# Patient Record
Sex: Female | Born: 1985 | Race: White | Hispanic: No | Marital: Married | Smoking: Never smoker
Health system: Southern US, Community
[De-identification: ages and names within clinical notes are randomized; demographics above are authoritative.]

## PROBLEM LIST (undated history)

## (undated) DIAGNOSIS — L309 Dermatitis, unspecified: Secondary | ICD-10-CM

## (undated) DIAGNOSIS — F909 Attention-deficit hyperactivity disorder, unspecified type: Secondary | ICD-10-CM

## (undated) DIAGNOSIS — S2239XA Fracture of one rib, unspecified side, initial encounter for closed fracture: Secondary | ICD-10-CM

## (undated) DIAGNOSIS — F849 Pervasive developmental disorder, unspecified: Secondary | ICD-10-CM

## (undated) DIAGNOSIS — S36116A Major laceration of liver, initial encounter: Secondary | ICD-10-CM

## (undated) DIAGNOSIS — S3210XA Unspecified fracture of sacrum, initial encounter for closed fracture: Secondary | ICD-10-CM

## (undated) DIAGNOSIS — S2249XA Multiple fractures of ribs, unspecified side, initial encounter for closed fracture: Secondary | ICD-10-CM

## (undated) HISTORY — DX: Dermatitis, unspecified: L30.9

## (undated) HISTORY — DX: Pervasive developmental disorder, unspecified: F84.9

## (undated) HISTORY — DX: Multiple fractures of ribs, unspecified side, initial encounter for closed fracture: S22.49XA

## (undated) HISTORY — DX: Fracture of one rib, unspecified side, initial encounter for closed fracture: S22.39XA

## (undated) HISTORY — DX: Major laceration of liver, initial encounter: S36.116A

## (undated) HISTORY — DX: Unspecified fracture of sacrum, initial encounter for closed fracture: S32.10XA

## (undated) HISTORY — DX: Attention-deficit hyperactivity disorder, unspecified type: F90.9

---
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
## 2005-10-17 ENCOUNTER — Ambulatory Visit: Payer: Self-pay | Admitting: Internal Medicine

## 2006-10-26 DIAGNOSIS — F909 Attention-deficit hyperactivity disorder, unspecified type: Secondary | ICD-10-CM | POA: Insufficient documentation

## 2008-07-15 ENCOUNTER — Encounter: Payer: Self-pay | Admitting: Internal Medicine

## 2009-12-21 ENCOUNTER — Ambulatory Visit: Payer: Self-pay | Admitting: Internal Medicine

## 2009-12-21 DIAGNOSIS — F849 Pervasive developmental disorder, unspecified: Secondary | ICD-10-CM | POA: Insufficient documentation

## 2010-02-11 ENCOUNTER — Encounter: Payer: Self-pay | Admitting: Internal Medicine

## 2010-05-19 ENCOUNTER — Telehealth: Payer: Self-pay | Admitting: *Deleted

## 2010-07-15 ENCOUNTER — Telehealth: Payer: Self-pay | Admitting: *Deleted

## 2010-08-31 ENCOUNTER — Telehealth: Payer: Self-pay | Admitting: Internal Medicine

## 2010-09-20 ENCOUNTER — Ambulatory Visit: Payer: Self-pay | Admitting: Internal Medicine

## 2010-09-20 ENCOUNTER — Other Ambulatory Visit: Admission: RE | Admit: 2010-09-20 | Discharge: 2010-09-20 | Payer: Self-pay | Admitting: Internal Medicine

## 2010-09-20 LAB — HM PAP SMEAR

## 2010-09-21 LAB — CONVERTED CEMR LAB
AST: 20 units/L (ref 0–37)
Albumin: 4.2 g/dL (ref 3.5–5.2)
Alkaline Phosphatase: 41 units/L (ref 39–117)
BUN: 16 mg/dL (ref 6–23)
Basophils Relative: 0.6 % (ref 0.0–3.0)
Bilirubin, Direct: 0.2 mg/dL (ref 0.0–0.3)
CO2: 28 meq/L (ref 19–32)
Eosinophils Relative: 1.8 % (ref 0.0–5.0)
Free T4: 0.86 ng/dL (ref 0.60–1.60)
GFR calc non Af Amer: 87.21 mL/min (ref >60)
Glucose, Bld: 69 mg/dL — ABNORMAL LOW (ref 70–99)
HDL: 88.5 mg/dL (ref >39.00)
Hemoglobin: 14 g/dL (ref 12.0–15.0)
Lymphocytes Relative: 38.1 % (ref 12.0–46.0)
Lymphs Abs: 2 10*3/uL (ref 0.7–4.0)
MCHC: 33.7 g/dL (ref 30.0–36.0)
Monocytes Absolute: 0.5 10*3/uL (ref 0.1–1.0)
Monocytes Relative: 9.2 % (ref 3.0–12.0)
TSH: 0.99 microintl units/mL (ref 0.35–5.50)
Total Protein: 6.8 g/dL (ref 6.0–8.3)
Triglycerides: 42 mg/dL (ref 0.0–149.0)
VLDL: 8.4 mg/dL (ref 0.0–40.0)

## 2010-09-23 ENCOUNTER — Encounter: Payer: Self-pay | Admitting: *Deleted

## 2010-09-23 LAB — CONVERTED CEMR LAB: Pap Smear: NEGATIVE

## 2010-10-26 ENCOUNTER — Telehealth: Payer: Self-pay | Admitting: Internal Medicine

## 2010-11-30 ENCOUNTER — Telehealth: Payer: Self-pay | Admitting: Internal Medicine

## 2010-12-08 NOTE — Progress Notes (Signed)
Summary: concerta refill  Phone Note Call from Patient   Caller: mother,[phone] Reason for Call: Refill Medication Summary of Call: Concerta. Initial call taken by: Rudy Jew, RN,  July 15, 2010 8:43 AM  Follow-up for Phone Call        ok to refill     she has check up sced for November  Follow-up by: Madelin Headings MD,  July 15, 2010 12:18 PM  Additional Follow-up for Phone Call Additional follow up Details #1::        Left message on machine that rx is ready to pick up. Additional Follow-up by: Romualdo Bolk, CMA (AAMA),  July 15, 2010 5:09 PM    New/Updated Medications: CONCERTA 27 MG TBCR (METHYLPHENIDATE HCL) once daily Prescriptions: CONCERTA 27 MG TBCR (METHYLPHENIDATE HCL) once daily  #30 x 0   Entered by:   Romualdo Bolk, CMA (AAMA)   Authorized by:   Madelin Headings MD   Signed by:   Romualdo Bolk, CMA (AAMA) on 07/15/2010   Method used:   Print then Give to Patient   RxID:   [id number]

## 2010-12-08 NOTE — Letter (Signed)
Summary: Psychological Evaluations 1998 - 2009  Psychological Evaluations 1998 - 2009   Imported By: Maryln Gottron 01/04/2010 13:14:58  _____________________________________________________________________  External Attachment:    Type:   Image     Comment:   External Document
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
## 2010-12-08 NOTE — Medication Information (Signed)
Summary: Coverage Approval for Concerta  Coverage Approval for Concerta   Imported By: Maryln Gottron 02/15/2010 12:31:03  _____________________________________________________________________  External Attachment:    Type:   Image     Comment:   External Document

## 2010-12-08 NOTE — Letter (Signed)
Summary: Results Follow-up Letter  [place] [facility]  [address] [place], [place] [address]   Phone: [phone]  Fax: [phone]    09/23/2010  [address] [place], [place]  [address]  Dear Ms. Merida,   The following are the results of your recent test(s):  Test     Result     Pap Smear    Normal_______  Not Normal_____  If you have any questions, please give [place] a call at [phone].  Sincerely,  Tor Netters, CMA (AAMA) [place] at [facility]

## 2010-12-08 NOTE — Progress Notes (Signed)
Summary: refill   Phone Note Call from Patient Call back at Home Phone [phone]   Caller: Patient Summary of Call: refill on concerta  Initial call taken by: Romualdo Bolk, CMA Duncan Dull),  November 30, 2010 2:15 PM  Follow-up for Phone Call        Pt aware that rx will be ready in am. Follow-up by: Romualdo Bolk, CMA (AAMA),  November 30, 2010 2:18 PM    Prescriptions: CONCERTA 27 MG TBCR (METHYLPHENIDATE HCL) once daily  #30 x 0   Entered by:   Romualdo Bolk, CMA (AAMA)   Authorized by:   Madelin Headings MD   Signed by:   Romualdo Bolk, CMA (AAMA) on 11/30/2010   Method used:   Print then Give to Patient   RxID:   [id number]
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
## 2010-12-08 NOTE — Progress Notes (Signed)
Summary: refill on concerta  Phone Note Call from Patient Call back at [phone]   Caller: Patient Summary of Call: Pt needs a refill on concerta. Initial call taken by: Romualdo Bolk, CMA Duncan Dull),  August 31, 2010 8:53 AM  Follow-up for Phone Call        Pt aware that rx is ready to pick up. Follow-up by: Romualdo Bolk, CMA (AAMA),  August 31, 2010 4:02 PM    Prescriptions: CONCERTA 27 MG TBCR (METHYLPHENIDATE HCL) once daily  #30 x 0   Entered by:   Romualdo Bolk, CMA (AAMA)   Authorized by:   Madelin Headings MD   Signed by:   Romualdo Bolk, CMA (AAMA) on 08/31/2010   Method used:   Print then Give to Patient   RxID:   [id number]

## 2010-12-08 NOTE — Progress Notes (Signed)
Summary: refill  Phone Note Call from Patient Call back at Home Phone [phone]   Caller: Patient Summary of Call: refill concerta Initial call taken by: Romualdo Bolk, CMA Duncan Dull),  October 26, 2010 4:48 PM  Follow-up for Phone Call        Mom aware that rx will be ready to pick up in am. Follow-up by: Romualdo Bolk, CMA (AAMA),  October 26, 2010 4:50 PM    Prescriptions: CONCERTA 27 MG TBCR (METHYLPHENIDATE HCL) once daily  #30 x 0   Entered by:   Romualdo Bolk, CMA (AAMA)   Authorized by:   Madelin Headings MD   Signed by:   Romualdo Bolk, CMA (AAMA) on 10/26/2010   Method used:   Print then Give to Patient   RxID:   [id number]

## 2010-12-08 NOTE — Letter (Signed)
Summary: Letter from Mother  Letter from Mother   Imported By: Maryln Gottron 01/04/2010 13:12:27  _____________________________________________________________________  External Attachment:    Type:   Image     Comment:   External Document

## 2010-12-08 NOTE — Progress Notes (Signed)
Summary: new rx  Phone Note Call from Patient Call back at [phone]   Caller: Mom-rita Call For: Madelin Headings MD Summary of Call: Pt  need new rx concerta 27mg  Initial call taken by: Heron Sabins,  May 19, 2010 9:40 AM  Follow-up for Phone Call        Left message on machine about rx being ready to pick up and that she needs  a cpx before next refill. Follow-up by: Romualdo Bolk, CMA (AAMA),  May 19, 2010 11:59 AM    Prescriptions: CONCERTA 27 MG TBCR (METHYLPHENIDATE HCL) once daily  #30 x 0   Entered by:   Romualdo Bolk, CMA (AAMA)   Authorized by:   Madelin Headings MD   Signed by:   Romualdo Bolk, CMA (AAMA) on 05/19/2010   Method used:   Print then Give to Patient   RxID:   [id number]

## 2010-12-08 NOTE — Letter (Signed)
Summary: Medical History for School  Medical History for School   Imported By: Maryln Gottron 01/04/2010 13:10:26  _____________________________________________________________________  External Attachment:    Type:   Image     Comment:   External Document

## 2011-01-23 ENCOUNTER — Telehealth: Payer: Self-pay | Admitting: *Deleted

## 2011-01-23 MED ORDER — METHYLPHENIDATE HCL ER (OSM) 27 MG PO TBCR: 27.0000 mg | EXTENDED_RELEASE_TABLET | Freq: Every day | ORAL | Status: DC

## 2011-01-24 NOTE — Telephone Encounter (Signed)
Rx is ready to pick up. Left message on machine that rx is ready to pick up.

## 2011-02-21 ENCOUNTER — Telehealth: Payer: Self-pay | Admitting: *Deleted

## 2011-02-21 MED ORDER — METHYLPHENIDATE HCL ER (OSM) 27 MG PO TBCR: 27.0000 mg | EXTENDED_RELEASE_TABLET | Freq: Every day | ORAL | Status: DC

## 2011-02-21 NOTE — Telephone Encounter (Signed)
Pt needs a refill on concerta. Pt aware that rx will be ready to pick up in the am.

## 2011-03-22 ENCOUNTER — Encounter: Payer: Self-pay | Admitting: Internal Medicine

## 2011-03-22 DIAGNOSIS — S3210XA Unspecified fracture of sacrum, initial encounter for closed fracture: Secondary | ICD-10-CM | POA: Insufficient documentation

## 2011-03-22 DIAGNOSIS — F849 Pervasive developmental disorder, unspecified: Secondary | ICD-10-CM | POA: Insufficient documentation

## 2011-03-23 ENCOUNTER — Ambulatory Visit: Payer: Self-pay | Admitting: Internal Medicine
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
## 2011-04-05 ENCOUNTER — Telehealth: Payer: Self-pay | Admitting: *Deleted

## 2011-04-05 MED ORDER — METHYLPHENIDATE HCL ER (OSM) 27 MG PO TBCR: 27.0000 mg | EXTENDED_RELEASE_TABLET | Freq: Every day | ORAL | Status: DC

## 2011-04-05 NOTE — Telephone Encounter (Signed)
Pt to pick up rx tomorrow. Pt to make rov when she picks up rx.

## 2011-04-10 ENCOUNTER — Ambulatory Visit (INDEPENDENT_AMBULATORY_CARE_PROVIDER_SITE_OTHER): Payer: BC Managed Care – PPO | Admitting: Internal Medicine

## 2011-04-10 ENCOUNTER — Encounter: Payer: Self-pay | Admitting: Internal Medicine

## 2011-04-10 DIAGNOSIS — F909 Attention-deficit hyperactivity disorder, unspecified type: Secondary | ICD-10-CM

## 2011-04-10 DIAGNOSIS — F849 Pervasive developmental disorder, unspecified: Secondary | ICD-10-CM

## 2011-04-10 MED ORDER — METHYLPHENIDATE HCL ER (OSM) 27 MG PO TBCR: EXTENDED_RELEASE_TABLET | ORAL | Status: DC
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
## 2011-04-13 ENCOUNTER — Encounter: Payer: Self-pay | Admitting: Internal Medicine

## 2011-08-31 ENCOUNTER — Telehealth: Payer: Self-pay | Admitting: *Deleted

## 2011-08-31 MED ORDER — METHYLPHENIDATE HCL ER (OSM) 27 MG PO TBCR: EXTENDED_RELEASE_TABLET | ORAL | Status: DC

## 2011-08-31 NOTE — Telephone Encounter (Signed)
Refill on concerta  

## 2011-10-10 ENCOUNTER — Telehealth: Payer: Self-pay | Admitting: *Deleted

## 2011-10-10 NOTE — Telephone Encounter (Signed)
Refill on concerta. Pt got 3 rx's on 08/31/11. Pt should still have another rx. Left message to call back.

## 2011-10-20 NOTE — Telephone Encounter (Signed)
Left message to call back  

## 2011-10-24 NOTE — Telephone Encounter (Signed)
Left message to call back  

## 2011-11-03 NOTE — Telephone Encounter (Signed)
Pt never called back.

## 2012-01-03 ENCOUNTER — Telehealth: Payer: Self-pay | Admitting: *Deleted

## 2012-01-03 MED ORDER — METHYLPHENIDATE HCL ER (OSM) 27 MG PO TBCR: EXTENDED_RELEASE_TABLET | ORAL | Status: DC

## 2012-01-03 NOTE — Telephone Encounter (Signed)
Refill on concerta  Pt needs to schedule a follow up appt before next refill.

## 2012-02-06 ENCOUNTER — Encounter: Payer: Self-pay | Admitting: Internal Medicine

## 2012-02-06 ENCOUNTER — Ambulatory Visit (INDEPENDENT_AMBULATORY_CARE_PROVIDER_SITE_OTHER): Payer: BC Managed Care – PPO | Admitting: Internal Medicine

## 2012-02-06 VITALS — BP 98/64 | HR 72 | Temp 98.6°F | Ht 69.0 in | Wt 143.0 lb

## 2012-02-06 DIAGNOSIS — F849 Pervasive developmental disorder, unspecified: Secondary | ICD-10-CM

## 2012-02-06 DIAGNOSIS — F909 Attention-deficit hyperactivity disorder, unspecified type: Secondary | ICD-10-CM

## 2012-02-06 MED ORDER — METHYLPHENIDATE HCL ER (OSM) 27 MG PO TBCR: EXTENDED_RELEASE_TABLET | ORAL | Status: DC

## 2012-02-06 NOTE — Progress Notes (Signed)
  Subjective:    Patient ID: Bethany Davila, female    DOB: 03/09/1986, 26 y.o.   MRN: [id number]  HPI Patient comes in today for follow up ;   Med check . Visit alone .  Didn't get cpx as planned  ;here for med check 4 months later.  But doing very well .  Taking same med  Without sig se   And taking Concerta about 4 days per week for work and other days. Sleep and activity level is ok.  No major changes  In health  ,injury surgery or hospitalizations. Rides horses   Review of Systems No ha vision change CP SOB falling syncope . Denies sleep problem .  Periods nl cramps for  a few days  No major injury .  New rashes   Past history family history social history reviewed in the electronic medical record. Outpatient Prescriptions Prior to Visit  Medication Sig Dispense Refill  . methylphenidate (CONCERTA) 27 MG CR tablet 1 po daily. Fill on or after 10/01/11  30 tablet  0  . methylphenidate (CONCERTA) 27 MG CR tablet 1 po daily. Fill on or after 10/29/11  30 tablet  0  . methylphenidate (CONCERTA) 27 MG CR tablet 1 po daily fill  30 tablet  0       Objective:   Physical Exam  BP 98/64  Pulse 72  Temp(Src) 98.6 F (37 C) (Oral)  Ht 5\' 9"  (1.753 m)  Wt 143 lb (64.864 kg)  BMI 21.12 kg/m2  SpO2 98%  LMP 01/31/2012  Wd wn in nad.  HEENT grossly normal Neck: Supple without adenopathy or masses or bruits Chest:  Clear to A&P without wheezes rales or rhonchi CV:  S1-S2 no gallops or murmurs peripheral perfusion is normal Abdomen:  Sof,t normal bowel sounds without hepatosplenomegaly, no guarding rebound or masses no CVA tenderness NEURO no tremor abnornmal movement  Oriented x 3 . Skin: normal capillary refill ,turgor , color: No acute rashes ,petechiae or bruising      Assessment & Plan:   ADHD  developmental delay   Good effect of med no sig side effects of meds   Disc and refill for 3 months   And plan fu  CPX PV in 6 months   Total visit > 50% spent  counseling and coordinating care

## 2012-02-06 NOTE — Patient Instructions (Signed)
No change in  meds  Call for refills .  CPX  In 6 months or as needed

## 2012-04-26 ENCOUNTER — Telehealth: Payer: Self-pay | Admitting: Family Medicine

## 2012-04-26 NOTE — Telephone Encounter (Signed)
I complete a prior auth on Bethany Davila's Methylphenidate ER, but [facility] states the still need to receive a prescription. Please fax one to them at [phone]. Thanks.

## 2012-04-29 ENCOUNTER — Other Ambulatory Visit: Payer: Self-pay | Admitting: Family Medicine

## 2012-04-29 NOTE — Telephone Encounter (Signed)
Last seen on 02/06/12.  How much do you wish to give?

## 2012-04-29 NOTE — Telephone Encounter (Signed)
Ok to give 90 days  ( 3 x 30?)

## 2012-04-29 NOTE — Telephone Encounter (Signed)
Left message for the pt to call back.

## 2012-05-01 NOTE — Telephone Encounter (Signed)
Left message for the pt to return my call. 

## 2012-05-02 ENCOUNTER — Other Ambulatory Visit: Payer: Self-pay | Admitting: Family Medicine

## 2012-05-02 MED ORDER — METHYLPHENIDATE HCL ER (OSM) 27 MG PO TBCR: EXTENDED_RELEASE_TABLET | ORAL | Status: DC

## 2012-05-02 NOTE — Telephone Encounter (Signed)
Rx mailed to [facility].  Given #90 and 0 refills as this is a 90 day supply company.  [facility] [phone]  [facility] [address]

## 2012-09-24 ENCOUNTER — Telehealth: Payer: Self-pay | Admitting: Internal Medicine

## 2012-09-24 NOTE — Telephone Encounter (Signed)
Refill x 1 have her make appt

## 2012-09-24 NOTE — Telephone Encounter (Signed)
Pt needs new rx generic concerta 27 mg °

## 2012-09-24 NOTE — Telephone Encounter (Signed)
Patient seen on 02-06-12.  Was suppose to come back in 6 months for CPE.  No future appt.  Please advise.  Thanks!!

## 2012-09-25 NOTE — Telephone Encounter (Signed)
Left message on home phone for the pt to return my call. 

## 2012-09-27 ENCOUNTER — Other Ambulatory Visit: Payer: Self-pay | Admitting: Family Medicine

## 2012-09-27 MED ORDER — METHYLPHENIDATE HCL ER (OSM) 27 MG PO TBCR: EXTENDED_RELEASE_TABLET | ORAL | Status: DC

## 2012-09-27 NOTE — Telephone Encounter (Signed)
Pt's mom picked up the phone.  I asked for [name] and she answered.  Informed her that her rx was ready for pick up.  She then notified me that she only heard "[place]" and that she was not the pt.  She will notify her daughter.

## 2012-11-18 ENCOUNTER — Telehealth: Payer: Self-pay | Admitting: Internal Medicine

## 2012-11-18 MED ORDER — METHYLPHENIDATE HCL ER (OSM) 27 MG PO TBCR: 27.0000 mg | EXTENDED_RELEASE_TABLET | ORAL | Status: DC

## 2012-11-18 NOTE — Telephone Encounter (Signed)
Left message on below provided number.

## 2012-11-18 NOTE — Telephone Encounter (Signed)
Pt requesting CONCERTA refill (methylphenidate (CONCERTA) 27 MG CR tablet). Please call when ready for pick up.

## 2012-11-18 NOTE — Telephone Encounter (Signed)
Printed for WP to sign. 

## 2013-01-01 ENCOUNTER — Telehealth: Payer: Self-pay | Admitting: Internal Medicine

## 2013-01-01 NOTE — Telephone Encounter (Signed)
Pt needs refill ofmethylphenidate (CONCERTA) 27 MG CR tablet

## 2013-01-02 ENCOUNTER — Other Ambulatory Visit: Payer: Self-pay | Admitting: Family Medicine

## 2013-01-02 MED ORDER — METHYLPHENIDATE HCL ER (OSM) 27 MG PO TBCR: 27.0000 mg | EXTENDED_RELEASE_TABLET | ORAL | Status: DC

## 2013-01-02 NOTE — Telephone Encounter (Signed)
Patient notified to pick up at the front desk. 

## 2013-01-27 ENCOUNTER — Encounter: Payer: Self-pay | Admitting: Internal Medicine

## 2013-01-27 ENCOUNTER — Ambulatory Visit (INDEPENDENT_AMBULATORY_CARE_PROVIDER_SITE_OTHER): Payer: Medicaid Other | Admitting: Internal Medicine

## 2013-01-27 VITALS — BP 106/64 | HR 70 | Temp 98.4°F | Wt 139.0 lb

## 2013-01-27 DIAGNOSIS — F909 Attention-deficit hyperactivity disorder, unspecified type: Secondary | ICD-10-CM

## 2013-01-27 DIAGNOSIS — Z79899 Other long term (current) drug therapy: Secondary | ICD-10-CM | POA: Insufficient documentation

## 2013-01-27 DIAGNOSIS — F849 Pervasive developmental disorder, unspecified: Secondary | ICD-10-CM

## 2013-01-27 MED ORDER — METHYLPHENIDATE HCL ER (OSM) 27 MG PO TBCR: 27.0000 mg | EXTENDED_RELEASE_TABLET | ORAL | Status: DC

## 2013-01-27 MED ORDER — METHYLPHENIDATE HCL ER (OSM) 27 MG PO TBCR: EXTENDED_RELEASE_TABLET | ORAL | Status: DC

## 2013-01-27 NOTE — Patient Instructions (Addendum)
Continue lifestyle intervention healthy eating and exercise .  Contact us for refills     Med check in 6 months or as needed
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
## 2013-06-17 ENCOUNTER — Telehealth: Payer: Self-pay | Admitting: Internal Medicine

## 2013-06-17 NOTE — Telephone Encounter (Signed)
Pt is requesting refill on Concerta rx. Please call when ready for pick up.

## 2013-06-18 ENCOUNTER — Other Ambulatory Visit: Payer: Self-pay | Admitting: Family Medicine

## 2013-06-18 MED ORDER — METHYLPHENIDATE HCL ER (OSM) 27 MG PO TBCR: 27.0000 mg | EXTENDED_RELEASE_TABLET | ORAL | Status: DC

## 2013-06-18 MED ORDER — METHYLPHENIDATE HCL ER (OSM) 27 MG PO TBCR: EXTENDED_RELEASE_TABLET | ORAL | Status: DC

## 2013-06-18 NOTE — Telephone Encounter (Signed)
Ok to fill 

## 2013-06-18 NOTE — Telephone Encounter (Signed)
Left message on machine for the pt to pick up at the front desk.

## 2013-06-18 NOTE — Telephone Encounter (Signed)
Patient due to come back in Sept.  Ok to fill for 30 days?

## 2013-06-25 ENCOUNTER — Ambulatory Visit (INDEPENDENT_AMBULATORY_CARE_PROVIDER_SITE_OTHER): Payer: Medicaid Other | Admitting: Internal Medicine

## 2013-06-25 ENCOUNTER — Encounter: Payer: Self-pay | Admitting: Internal Medicine

## 2013-06-25 VITALS — BP 116/80 | HR 73 | Temp 98.3°F | Wt 140.0 lb

## 2013-06-25 DIAGNOSIS — F909 Attention-deficit hyperactivity disorder, unspecified type: Secondary | ICD-10-CM

## 2013-06-25 DIAGNOSIS — N946 Dysmenorrhea, unspecified: Secondary | ICD-10-CM

## 2013-06-25 DIAGNOSIS — Z79899 Other long term (current) drug therapy: Secondary | ICD-10-CM

## 2013-06-25 DIAGNOSIS — J029 Acute pharyngitis, unspecified: Secondary | ICD-10-CM

## 2013-06-25 DIAGNOSIS — N926 Irregular menstruation, unspecified: Secondary | ICD-10-CM

## 2013-06-25 MED ORDER — METHYLPHENIDATE HCL ER (OSM) 27 MG PO TBCR: 27.0000 mg | EXTENDED_RELEASE_TABLET | ORAL | Status: DC

## 2013-06-25 MED ORDER — METHYLPHENIDATE HCL ER (OSM) 27 MG PO TBCR: EXTENDED_RELEASE_TABLET | ORAL | Status: DC

## 2013-06-25 MED ORDER — NORETHIN ACE-ETH ESTRAD-FE 1.5-30 MG-MCG PO TABS: 1.0000 | ORAL_TABLET | Freq: Every day | ORAL | Status: DC

## 2013-06-25 NOTE — Patient Instructions (Addendum)
Begin ocps for now to see if helps the  irreg bleeding and cramps. MAy take 2-3 months to wrk the best. Make sure you take  Every day regularly .  Schedule  Visit in 2-3 months    For pap and pelvic "wellness " and med check
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
## 2013-09-10 ENCOUNTER — Encounter: Payer: Self-pay | Admitting: Internal Medicine

## 2013-09-22 ENCOUNTER — Encounter: Payer: Self-pay | Admitting: Internal Medicine

## 2013-09-23 ENCOUNTER — Encounter: Payer: Self-pay | Admitting: Internal Medicine

## 2013-09-23 ENCOUNTER — Ambulatory Visit (INDEPENDENT_AMBULATORY_CARE_PROVIDER_SITE_OTHER): Payer: Medicaid Other | Admitting: Internal Medicine

## 2013-09-23 ENCOUNTER — Other Ambulatory Visit (HOSPITAL_COMMUNITY)
Admission: RE | Admit: 2013-09-23 | Discharge: 2013-09-23 | Disposition: A | Discharge status: Home / Self Care | Payer: Medicaid Other | Source: Ambulatory Visit | Attending: Internal Medicine | Admitting: Internal Medicine

## 2013-09-23 VITALS — BP 120/80 | HR 68 | Temp 97.6°F | Ht 67.75 in | Wt 147.0 lb

## 2013-09-23 DIAGNOSIS — Z01419 Encounter for gynecological examination (general) (routine) without abnormal findings: Secondary | ICD-10-CM

## 2013-09-23 DIAGNOSIS — Z79899 Other long term (current) drug therapy: Secondary | ICD-10-CM

## 2013-09-23 DIAGNOSIS — F849 Pervasive developmental disorder, unspecified: Secondary | ICD-10-CM

## 2013-09-23 DIAGNOSIS — N946 Dysmenorrhea, unspecified: Secondary | ICD-10-CM

## 2013-09-23 DIAGNOSIS — Z0000 Encounter for general adult medical examination without abnormal findings: Secondary | ICD-10-CM

## 2013-09-23 DIAGNOSIS — N926 Irregular menstruation, unspecified: Secondary | ICD-10-CM

## 2013-09-23 DIAGNOSIS — F909 Attention-deficit hyperactivity disorder, unspecified type: Secondary | ICD-10-CM

## 2013-09-23 LAB — LIPID PANEL
Cholesterol: 247 mg/dL — ABNORMAL HIGH (ref 0–200)
HDL: 93.2 mg/dL (ref >39.00)
Triglycerides: 39 mg/dL (ref 0.0–149.0)
VLDL: 7.8 mg/dL (ref 0.0–40.0)

## 2013-09-23 LAB — CBC WITH DIFFERENTIAL/PLATELET
Basophils Absolute: 0.1 10*3/uL (ref 0.0–0.1)
Eosinophils Absolute: 0.1 10*3/uL (ref 0.0–0.7)
Hemoglobin: 14.4 g/dL (ref 12.0–15.0)
Lymphocytes Relative: 36.5 % (ref 12.0–46.0)
Lymphs Abs: 3.1 10*3/uL (ref 0.7–4.0)
MCHC: 33.8 g/dL (ref 30.0–36.0)
MCV: 89.6 fl (ref 78.0–100.0)
Monocytes Absolute: 0.7 10*3/uL (ref 0.1–1.0)
Neutro Abs: 4.6 10*3/uL (ref 1.4–7.7)
RDW: 13.7 % (ref 11.5–14.6)

## 2013-09-23 LAB — BASIC METABOLIC PANEL
CO2: 27 mEq/L (ref 19–32)
Calcium: 10.2 mg/dL (ref 8.4–10.5)
Chloride: 104 mEq/L (ref 96–112)
Glucose, Bld: 79 mg/dL (ref 70–99)
Sodium: 138 mEq/L (ref 135–145)

## 2013-09-23 LAB — HEPATIC FUNCTION PANEL
Albumin: 4.3 g/dL (ref 3.5–5.2)
Alkaline Phosphatase: 34 U/L — ABNORMAL LOW (ref 39–117)
Total Protein: 7.3 g/dL (ref 6.0–8.3)

## 2013-09-23 LAB — LDL CHOLESTEROL, DIRECT: Direct LDL: 149.6 mg/dL

## 2013-09-23 MED ORDER — METHYLPHENIDATE HCL ER (OSM) 27 MG PO TBCR: 27.0000 mg | EXTENDED_RELEASE_TABLET | ORAL | Status: DC

## 2013-09-23 MED ORDER — NORETHIN ACE-ETH ESTRAD-FE 1.5-30 MG-MCG PO TABS: 1.0000 | ORAL_TABLET | Freq: Every day | ORAL | Status: DC

## 2013-09-23 NOTE — Patient Instructions (Signed)
Will notify you  of labs when available. Stay on hormones for now as they seem to help but take regulary to hel p the bleeding pattern.  ROV med check in 6 months or as needed .  Advise  Flu vaccine and Tdap booster when you can .   Preventive Care for Adults, Female A healthy lifestyle and preventive care can promote health and wellness. Preventive health guidelines for women include the following key practices.  A routine yearly physical is a good way to check with your caregiver about your health and preventive screening. It is a chance to share any concerns and updates on your health, and to receive a thorough exam.  Visit your dentist for a routine exam and preventive care every 6 months. Brush your teeth twice a day and floss once a day. Good oral hygiene prevents tooth decay and gum disease.  The frequency of eye exams is based on your age, health, family medical history, use of contact lenses, and other factors. Follow your caregiver's recommendations for frequency of eye exams.  Eat a healthy diet. Foods like vegetables, fruits, whole grains, low-fat dairy products, and lean protein foods contain the nutrients you need without too many calories. Decrease your intake of foods high in solid fats, added sugars, and salt. Eat the right amount of calories for you.Get information about a proper diet from your caregiver, if necessary.  Regular physical exercise is one of the most important things you can do for your health. Most adults should get at least 150 minutes of moderate-intensity exercise (any activity that increases your heart rate and causes you to sweat) each week. In addition, most adults need muscle-strengthening exercises on 2 or more days a week.  Maintain a healthy weight. The body mass index (BMI) is a screening tool to identify possible weight problems. It provides an estimate of body fat based on height and weight. Your caregiver can help determine your BMI, and can help  you achieve or maintain a healthy weight.For adults 20 years and older:  A BMI below 18.5 is considered underweight.  A BMI of 18.5 to 24.9 is normal.  A BMI of 25 to 29.9 is considered overweight.  A BMI of 30 and above is considered obese.  Maintain normal blood lipids and cholesterol levels by exercising and minimizing your intake of saturated fat. Eat a balanced diet with plenty of fruit and vegetables. Blood tests for lipids and cholesterol should begin at age 105 and be repeated every 5 years. If your lipid or cholesterol levels are high, you are over 50, or you are at high risk for heart disease, you may need your cholesterol levels checked more frequently.Ongoing high lipid and cholesterol levels should be treated with medicines if diet and exercise are not effective.  If you smoke, find out from your caregiver how to quit. If you do not use tobacco, do not start.  Lung cancer screening is recommended for adults aged 62 80 years who are at high risk for developing lung cancer because of a history of smoking. Yearly low-dose computed tomography (CT) is recommended for people who have at least a 30-pack-year history of smoking and are a current smoker or have quit within the past 15 years. A pack year of smoking is smoking an average of 1 pack of cigarettes a day for 1 year (for example: 1 pack a day for 30 years or 2 packs a day for 15 years). Yearly screening should continue until the smoker  has stopped smoking for at least 15 years. Yearly screening should also be stopped for people who develop a health problem that would prevent them from having lung cancer treatment.  If you are pregnant, do not drink alcohol. If you are breastfeeding, be very cautious about drinking alcohol. If you are not pregnant and choose to drink alcohol, do not exceed 1 drink per day. One drink is considered to be 12 ounces (355 mL) of beer, 5 ounces (148 mL) of wine, or 1.5 ounces (44 mL) of liquor.  Avoid use  of street drugs. Do not share needles with anyone. Ask for help if you need support or instructions about stopping the use of drugs.  High blood pressure causes heart disease and increases the risk of stroke. Your blood pressure should be checked at least every 1 to 2 years. Ongoing high blood pressure should be treated with medicines if weight loss and exercise are not effective.  If you are 58 to 27 years old, ask your caregiver if you should take aspirin to prevent strokes.  Diabetes screening involves taking a blood sample to check your fasting blood sugar level. This should be done once every 3 years, after age 93, if you are within normal weight and without risk factors for diabetes. Testing should be considered at a younger age or be carried out more frequently if you are overweight and have at least 1 risk factor for diabetes.  Breast cancer screening is essential preventive care for women. You should practice "breast self-awareness." This means understanding the normal appearance and feel of your breasts and may include breast self-examination. Any changes detected, no matter how small, should be reported to a caregiver. Women in their 22s and 30s should have a clinical breast exam (CBE) by a caregiver as part of a regular health exam every 1 to 3 years. After age 58, women should have a CBE every year. Starting at age 95, women should consider having a mammography (breast X-ray test) every year. Women who have a family history of breast cancer should talk to their caregiver about genetic screening. Women at a high risk of breast cancer should talk to their caregivers about having magnetic resonance imaging (MRI) and a mammography every year.  Breast cancer gene (BRCA)-related cancer risk assessment is recommended for women who have family members with BRCA-related cancers. BRCA-related cancers include breast, ovarian, tubal, and peritoneal cancers. Having family members with these cancers may be  associated with an increased risk for harmful changes (mutations) in the breast cancer genes BRCA1 and BRCA2. Results of the assessment will determine the need for genetic counseling and BRCA1 and BRCA2 testing.  The Pap test is a screening test for cervical cancer. A Pap test can show cell changes on the cervix that might become cervical cancer if left untreated. A Pap test is a procedure in which cells are obtained and examined from the lower end of the uterus (cervix).  Women should have a Pap test starting at age 62.  Between ages 81 and 53, Pap tests should be repeated every 2 years.  Beginning at age 59, you should have a Pap test every 3 years as long as the past 3 Pap tests have been normal.  Some women have medical problems that increase the chance of getting cervical cancer. Talk to your caregiver about these problems. It is especially important to talk to your caregiver if a new problem develops soon after your last Pap test. In these cases, your  caregiver may recommend more frequent screening and Pap tests.  The above recommendations are the same for women who have or have not gotten the vaccine for human papillomavirus (HPV).  If you had a hysterectomy for a problem that was not cancer or a condition that could lead to cancer, then you no longer need Pap tests. Even if you no longer need a Pap test, a regular exam is a good idea to make sure no other problems are starting.  If you are between ages 30 and 64, and you have had normal Pap tests going back 10 years, you no longer need Pap tests. Even if you no longer need a Pap test, a regular exam is a good idea to make sure no other problems are starting.  If you have had past treatment for cervical cancer or a condition that could lead to cancer, you need Pap tests and screening for cancer for at least 20 years after your treatment.  If Pap tests have been discontinued, risk factors (such as a new sexual partner) need to be reassessed  to determine if screening should be resumed.  The HPV test is an additional test that may be used for cervical cancer screening. The HPV test looks for the virus that can cause the cell changes on the cervix. The cells collected during the Pap test can be tested for HPV. The HPV test could be used to screen women aged 70 years and older, and should be used in women of any age who have unclear Pap test results. After the age of 60, women should have HPV testing at the same frequency as a Pap test.  Colorectal cancer can be detected and often prevented. Most routine colorectal cancer screening begins at the age of 37 and continues through age 24. However, your caregiver may recommend screening at an earlier age if you have risk factors for colon cancer. On a yearly basis, your caregiver may provide home test kits to check for hidden blood in the stool. Use of a small camera at the end of a tube, to directly examine the colon (sigmoidoscopy or colonoscopy), can detect the earliest forms of colorectal cancer. Talk to your caregiver about this at age 19, when routine screening begins. Direct examination of the colon should be repeated every 5 to 10 years through age 34, unless early forms of pre-cancerous polyps or small growths are found.  Hepatitis C blood testing is recommended for all people born from [birth year] through 1965 and any individual with known risks for hepatitis C.  Practice safe sex. Use condoms and avoid high-risk sexual practices to reduce the spread of sexually transmitted infections (STIs). STIs include gonorrhea, chlamydia, syphilis, trichomonas, herpes, HPV, and human immunodeficiency virus (HIV). Herpes, HIV, and HPV are viral illnesses that have no cure. They can result in disability, cancer, and death. Sexually active women aged 17 and younger should be checked for chlamydia. Older women with new or multiple partners should also be tested for chlamydia. Testing for other STIs is recommended  if you are sexually active and at increased risk.  Osteoporosis is a disease in which the bones lose minerals and strength with aging. This can result in serious bone fractures. The risk of osteoporosis can be identified using a bone density scan. Women ages 8 and over and women at risk for fractures or osteoporosis should discuss screening with their caregivers. Ask your caregiver whether you should take a calcium supplement or vitamin D to reduce the rate  of osteoporosis.  Menopause can be associated with physical symptoms and risks. Hormone replacement therapy is available to decrease symptoms and risks. You should talk to your caregiver about whether hormone replacement therapy is right for you.  Use sunscreen. Apply sunscreen liberally and repeatedly throughout the day. You should seek shade when your shadow is shorter than you. Protect yourself by wearing long sleeves, pants, a wide-brimmed hat, and sunglasses year round, whenever you are outdoors.  Once a month, do a whole body skin exam, using a mirror to look at the skin on your back. Notify your caregiver of new moles, moles that have irregular borders, moles that are larger than a pencil eraser, or moles that have changed in shape or color.  Stay current with required immunizations.  Influenza vaccine. All adults should be immunized every year.  Tetanus, diphtheria, and acellular pertussis (Td, Tdap) vaccine. Pregnant women should receive 1 dose of Tdap vaccine during each pregnancy. The dose should be obtained regardless of the length of time since the last dose. Immunization is preferred during the 27th to 36th week of gestation. An adult who has not previously received Tdap or who does not know her vaccine status should receive 1 dose of Tdap. This initial dose should be followed by tetanus and diphtheria toxoids (Td) booster doses every 10 years. Adults with an unknown or incomplete history of completing a 3-dose immunization series  with Td-containing vaccines should begin or complete a primary immunization series including a Tdap dose. Adults should receive a Td booster every 10 years.  Varicella vaccine. An adult without evidence of immunity to varicella should receive 2 doses or a second dose if she has previously received 1 dose. Pregnant females who do not have evidence of immunity should receive the first dose after pregnancy. This first dose should be obtained before leaving the health care facility. The second dose should be obtained 4 8 weeks after the first dose.  Human papillomavirus (HPV) vaccine. Females aged 45 26 years who have not received the vaccine previously should obtain the 3-dose series. The vaccine is not recommended for use in pregnant females. However, pregnancy testing is not needed before receiving a dose. If a female is found to be pregnant after receiving a dose, no treatment is needed. In that case, the remaining doses should be delayed until after the pregnancy. Immunization is recommended for any person with an immunocompromised condition through the age of 26 years if she did not get any or all doses earlier. During the 3-dose series, the second dose should be obtained 4 8 weeks after the first dose. The third dose should be obtained 24 weeks after the first dose and 16 weeks after the second dose.  Zoster vaccine. One dose is recommended for adults aged 33 years or older unless certain conditions are present.  Measles, mumps, and rubella (MMR) vaccine. Adults born before [birth year] generally are considered immune to measles and mumps. Adults born in [birth year] or later should have 1 or more doses of MMR vaccine unless there is a contraindication to the vaccine or there is laboratory evidence of immunity to each of the three diseases. A routine second dose of MMR vaccine should be obtained at least 28 days after the first dose for students attending postsecondary schools, health care workers, or international  travelers. People who received inactivated measles vaccine or an unknown type of measles vaccine during 1963 1967 should receive 2 doses of MMR vaccine. People who received inactivated mumps vaccine or  an unknown type of mumps vaccine before 1979 and are at high risk for mumps infection should consider immunization with 2 doses of MMR vaccine. For females of childbearing age, rubella immunity should be determined. If there is no evidence of immunity, females who are not pregnant should be vaccinated. If there is no evidence of immunity, females who are pregnant should delay immunization until after pregnancy. Unvaccinated health care workers born before [birth year] who lack laboratory evidence of measles, mumps, or rubella immunity or laboratory confirmation of disease should consider measles and mumps immunization with 2 doses of MMR vaccine or rubella immunization with 1 dose of MMR vaccine.  Pneumococcal 13-valent conjugate (PCV13) vaccine. When indicated, a person who is uncertain of her immunization history and has no record of immunization should receive the PCV13 vaccine. An adult aged 98 years or older who has certain medical conditions and has not been previously immunized should receive 1 dose of PCV13 vaccine. This PCV13 should be followed with a dose of pneumococcal polysaccharide (PPSV23) vaccine. The PPSV23 vaccine dose should be obtained at least 8 weeks after the dose of PCV13 vaccine. An adult aged 55 years or older who has certain medical conditions and previously received 1 or more doses of PPSV23 vaccine should receive 1 dose of PCV13. The PCV13 vaccine dose should be obtained 1 or more years after the last PPSV23 vaccine dose.  Pneumococcal polysaccharide (PPSV23) vaccine. When PCV13 is also indicated, PCV13 should be obtained first. All adults aged 81 years and older should be immunized. An adult younger than age 76 years who has certain medical conditions should be immunized. Any person who  resides in a nursing home or long-term care facility should be immunized. An adult smoker should be immunized. People with an immunocompromised condition and certain other conditions should receive both PCV13 and PPSV23 vaccines. People with human immunodeficiency virus (HIV) infection should be immunized as soon as possible after diagnosis. Immunization during chemotherapy or radiation therapy should be avoided. Routine use of PPSV23 vaccine is not recommended for American Indians, [address], or people younger than 65 years unless there are medical conditions that require PPSV23 vaccine. When indicated, people who have unknown immunization and have no record of immunization should receive PPSV23 vaccine. One-time revaccination 5 years after the first dose of PPSV23 is recommended for people aged 3 64 years who have chronic kidney failure, nephrotic syndrome, asplenia, or immunocompromised conditions. People who received 1 2 doses of PPSV23 before age 12 years should receive another dose of PPSV23 vaccine at age 59 years or later if at least 5 years have passed since the previous dose. Doses of PPSV23 are not needed for people immunized with PPSV23 at or after age 77 years.  Meningococcal vaccine. Adults with asplenia or persistent complement component deficiencies should receive 2 doses of quadrivalent meningococcal conjugate (MenACWY-D) vaccine. The doses should be obtained at least 2 months apart. Microbiologists working with certain meningococcal bacteria, military recruits, people at risk during an outbreak, and people who travel to or live in countries with a high rate of meningitis should be immunized. A first-year college student up through age 24 years who is living in a residence hall should receive a dose if she did not receive a dose on or after her 16th birthday. Adults who have certain high-risk conditions should receive one or more doses of vaccine.  Hepatitis A vaccine. Adults who wish to  be protected from this disease, have certain high-risk conditions, work with hepatitis A-infected  animals, work in hepatitis A research labs, or travel to or work in countries with a high rate of hepatitis A should be immunized. Adults who were previously unvaccinated and who anticipate close contact with an international adoptee during the first 60 days after arrival in the [place] from a country with a high rate of hepatitis A should be immunized.  Hepatitis B vaccine. Adults who wish to be protected from this disease, have certain high-risk conditions, may be exposed to blood or other infectious body fluids, are household contacts or sex partners of hepatitis B positive people, are clients or workers in certain care facilities, or travel to or work in countries with a high rate of hepatitis B should be immunized.  Haemophilus influenzae type b (Hib) vaccine. A previously unvaccinated person with asplenia or sickle cell disease or having a scheduled splenectomy should receive 1 dose of Hib vaccine. Regardless of previous immunization, a recipient of a hematopoietic stem cell transplant should receive a 3-dose series 6 12 months after her successful transplant. Hib vaccine is not recommended for adults with HIV infection. Preventive Services / Frequency Ages 13 to 91  Blood pressure check.** / Every 1 to 2 years.  Lipid and cholesterol check.** / Every 5 years beginning at age 54.  Clinical breast exam.** / Every 3 years for women in their 9s and 30s.  BRCA-related cancer risk assessment.** / For women who have family members with a BRCA-related cancer (breast, ovarian, tubal, or peritoneal cancers).  Pap test.** / Every 2 years from ages 2 through 62. Every 3 years starting at age 28 through age 80 or 83 with a history of 3 consecutive normal Pap tests.  HPV screening.** / Every 3 years from ages 57 through ages 56 to 44 with a history of 3 consecutive normal Pap tests.  Hepatitis C  blood test.** / For any individual with known risks for hepatitis C.  Skin self-exam. / Monthly.  Influenza vaccine. / Every year.  Tetanus, diphtheria, and acellular pertussis (Tdap, Td) vaccine.** / Consult your caregiver. Pregnant women should receive 1 dose of Tdap vaccine during each pregnancy. 1 dose of Td every 10 years.  Varicella vaccine.** / Consult your caregiver. Pregnant females who do not have evidence of immunity should receive the first dose after pregnancy.  HPV vaccine. / 3 doses over 6 months, if 26 and younger. The vaccine is not recommended for use in pregnant females. However, pregnancy testing is not needed before receiving a dose.  Measles, mumps, rubella (MMR) vaccine.** / You need at least 1 dose of MMR if you were born in 1957 or later. You may also need a 2nd dose. For females of childbearing age, rubella immunity should be determined. If there is no evidence of immunity, females who are not pregnant should be vaccinated. If there is no evidence of immunity, females who are pregnant should delay immunization until after pregnancy.  Pneumococcal 13-valent conjugate (PCV13) vaccine.** / Consult your caregiver.  Pneumococcal polysaccharide (PPSV23) vaccine.** / 1 to 2 doses if you smoke cigarettes or if you have certain conditions.  Meningococcal vaccine.** / 1 dose if you are age 9 to 52 years and a Orthoptist living in a residence hall, or have one of several medical conditions, you need to get vaccinated against meningococcal disease. You may also need additional booster doses.  Hepatitis A vaccine.** / Consult your caregiver.  Hepatitis B vaccine.** / Consult your caregiver.  Haemophilus influenzae type b (Hib) vaccine.** /  Consult your caregiver. Ages 69 to 60  Blood pressure check.** / Every 1 to 2 years.  Lipid and cholesterol check.** / Every 5 years beginning at age 57.  Lung cancer screening. / Every year if you are aged 39 80 years  and have a 30-pack-year history of smoking and currently smoke or have quit within the past 15 years. Yearly screening is stopped once you have quit smoking for at least 15 years or develop a health problem that would prevent you from having lung cancer treatment.  Clinical breast exam.** / Every year after age 62.  BRCA-related cancer risk assessment.** / For women who have family members with a BRCA-related cancer (breast, ovarian, tubal, or peritoneal cancers).  Mammogram.** / Every year beginning at age 56 and continuing for as long as you are in good health. Consult with your caregiver.  Pap test.** / Every 3 years starting at age 84 through age 64 or 72 with a history of 3 consecutive normal Pap tests.  HPV screening.** / Every 3 years from ages 80 through ages 29 to 70 with a history of 3 consecutive normal Pap tests.  Fecal occult blood test (FOBT) of stool. / Every year beginning at age 58 and continuing until age 46. You may not need to do this test if you get a colonoscopy every 10 years.  Flexible sigmoidoscopy or colonoscopy.** / Every 5 years for a flexible sigmoidoscopy or every 10 years for a colonoscopy beginning at age 54 and continuing until age 45.  Hepatitis C blood test.** / For all people born from [birth year] through 1965 and any individual with known risks for hepatitis C.  Skin self-exam. / Monthly.  Influenza vaccine. / Every year.  Tetanus, diphtheria, and acellular pertussis (Tdap/Td) vaccine.** / Consult your caregiver. Pregnant women should receive 1 dose of Tdap vaccine during each pregnancy. 1 dose of Td every 10 years.  Varicella vaccine.** / Consult your caregiver. Pregnant females who do not have evidence of immunity should receive the first dose after pregnancy.  Zoster vaccine.** / 1 dose for adults aged 24 years or older.  Measles, mumps, rubella (MMR) vaccine.** / You need at least 1 dose of MMR if you were born in 1957 or later. You may also need a 2nd  dose. For females of childbearing age, rubella immunity should be determined. If there is no evidence of immunity, females who are not pregnant should be vaccinated. If there is no evidence of immunity, females who are pregnant should delay immunization until after pregnancy.  Pneumococcal 13-valent conjugate (PCV13) vaccine.** / Consult your caregiver.  Pneumococcal polysaccharide (PPSV23) vaccine.** / 1 to 2 doses if you smoke cigarettes or if you have certain conditions.  Meningococcal vaccine.** / Consult your caregiver.  Hepatitis A vaccine.** / Consult your caregiver.  Hepatitis B vaccine.** / Consult your caregiver.  Haemophilus influenzae type b (Hib) vaccine.** / Consult your caregiver. Ages 51 and over  Blood pressure check.** / Every 1 to 2 years.  Lipid and cholesterol check.** / Every 5 years beginning at age 77.  Lung cancer screening. / Every year if you are aged 68 80 years and have a 30-pack-year history of smoking and currently smoke or have quit within the past 15 years. Yearly screening is stopped once you have quit smoking for at least 15 years or develop a health problem that would prevent you from having lung cancer treatment.  Clinical breast exam.** / Every year after age 23.  BRCA-related cancer risk  assessment.** / For women who have family members with a BRCA-related cancer (breast, ovarian, tubal, or peritoneal cancers).  Mammogram.** / Every year beginning at age 75 and continuing for as long as you are in good health. Consult with your caregiver.  Pap test.** / Every 3 years starting at age 86 through age 77 or 39 with a 3 consecutive normal Pap tests. Testing can be stopped between 65 and 70 with 3 consecutive normal Pap tests and no abnormal Pap or HPV tests in the past 10 years.  HPV screening.** / Every 3 years from ages 31 through ages 46 or 61 with a history of 3 consecutive normal Pap tests. Testing can be stopped between 65 and 70 with 3  consecutive normal Pap tests and no abnormal Pap or HPV tests in the past 10 years.  Fecal occult blood test (FOBT) of stool. / Every year beginning at age 19 and continuing until age 37. You may not need to do this test if you get a colonoscopy every 10 years.  Flexible sigmoidoscopy or colonoscopy.** / Every 5 years for a flexible sigmoidoscopy or every 10 years for a colonoscopy beginning at age 6 and continuing until age 27.  Hepatitis C blood test.** / For all people born from [birth year] through 1965 and any individual with known risks for hepatitis C.  Osteoporosis screening.** / A one-time screening for women ages 10 and over and women at risk for fractures or osteoporosis.  Skin self-exam. / Monthly.  Influenza vaccine. / Every year.  Tetanus, diphtheria, and acellular pertussis (Tdap/Td) vaccine.** / 1 dose of Td every 10 years.  Varicella vaccine.** / Consult your caregiver.  Zoster vaccine.** / 1 dose for adults aged 81 years or older.  Pneumococcal 13-valent conjugate (PCV13) vaccine.** / Consult your caregiver.  Pneumococcal polysaccharide (PPSV23) vaccine.** / 1 dose for all adults aged 71 years and older.  Meningococcal vaccine.** / Consult your caregiver.  Hepatitis A vaccine.** / Consult your caregiver.  Hepatitis B vaccine.** / Consult your caregiver.  Haemophilus influenzae type b (Hib) vaccine.** / Consult your caregiver. ** Family history and personal history of risk and conditions may change your caregiver's recommendations. Document Released: 12/19/2001 Document Revised: 02/17/2013 Document Reviewed: 03/20/2011 [facility] Patient Information 2014 [place], [place].

## 2013-09-23 NOTE — Progress Notes (Signed)
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 in countries with a high rate of hepatitis  A should be immunized. Adults who were previously unvaccinated and who anticipate close contact with an international adoptee during the first 60 days after arrival in the [place] from a country with a high rate of hepatitis A should be immunized.  Hepatitis B vaccine. Adults who wish to be protected from this disease, have certain high-risk conditions, may be exposed to blood or other infectious body fluids, are household contacts or sex partners of hepatitis B positive people, are clients or workers in certain care facilities, or travel to or work in countries with a high rate of hepatitis B should be immunized.  Haemophilus influenzae type b (Hib) vaccine. A previously unvaccinated person with asplenia or sickle cell disease or having a scheduled splenectomy should receive 1 dose of Hib vaccine. Regardless of previous immunization, a recipient of a hematopoietic stem cell transplant should receive a 3-dose series 6 12 months after her successful transplant. Hib vaccine is not recommended for adults  with HIV infection. Preventive Services / Frequency Ages 45 to 88  Blood pressure check.** / Every 1 to 2 years.  Lipid and cholesterol check.** / Every 5 years beginning at age 57.  Clinical breast exam.** / Every 3 years for women in their 26s and 30s.  BRCA-related cancer risk assessment.** / For women who have family members with a BRCA-related cancer (breast, ovarian, tubal, or peritoneal cancers).  Pap test.** / Every 2 years from ages 43 through 47. Every 3 years starting at age 81 through age 80 or 61 with a history of 3 consecutive normal Pap tests.  HPV screening.** / Every 3 years from ages 81 through ages 15 to 5 with a history of 3 consecutive normal Pap tests.  Hepatitis C blood test.** / For any individual with known risks for hepatitis C.  Skin self-exam. / Monthly.  Influenza vaccine. / Every year.  Tetanus, diphtheria, and acellular pertussis (Tdap, Td) vaccine.** / Consult your caregiver. Pregnant women should receive 1 dose of Tdap vaccine during each pregnancy. 1 dose of Td every 10 years.  Varicella vaccine.** / Consult your caregiver. Pregnant females who do not have evidence of immunity should receive the first dose after pregnancy.  HPV vaccine. / 3 doses over 6 months, if 26 and younger. The vaccine is not recommended for use in pregnant females. However, pregnancy testing is not needed before receiving a dose.  Measles, mumps, rubella (MMR) vaccine.** / You need at least 1 dose of MMR if you were born in 1957 or later. You may also need a 2nd dose. For females of childbearing age, rubella immunity should be determined. If there is no evidence of immunity, females who are not pregnant should be vaccinated. If there is no evidence of immunity, females who are pregnant should delay immunization until after pregnancy.  Pneumococcal 13-valent conjugate (PCV13) vaccine.** / Consult your caregiver.  Pneumococcal polysaccharide (PPSV23) vaccine.** / 1 to 2 doses if  you smoke cigarettes or if you have certain conditions.  Meningococcal vaccine.** / 1 dose if you are age 6 to 60 years and a Orthoptist living in a residence hall, or have one of several medical conditions, you need to get vaccinated against meningococcal disease. You may also need additional booster doses.  Hepatitis A vaccine.** / Consult your caregiver.  Hepatitis B vaccine.** / Consult your caregiver.  Haemophilus influenzae type b (Hib) vaccine.** / Consult your caregiver. Ages 28 to 77  Blood pressure check.** / Every 1 to 2 years.  Lipid  and cholesterol check.** / Every 5 years beginning at age 8.  Lung cancer screening. / Every year if you are aged 67 80 years and have a 30-pack-year history of smoking and currently smoke or have quit within the past 15 years. Yearly screening is stopped once you have quit smoking for at least 15 years or develop a health problem that would prevent you from having lung cancer treatment.  Clinical breast exam.** / Every year after age 35.  BRCA-related cancer risk assessment.** / For women who have family members with a BRCA-related cancer (breast, ovarian, tubal, or peritoneal cancers).  Mammogram.** / Every year beginning at age 34 and continuing for as long as you are in good health. Consult with your caregiver.  Pap test.** / Every 3 years starting at age 51 through age 20 or 65 with a history of 3 consecutive normal Pap tests.  HPV screening.** / Every 3 years from ages 68 through ages 33 to 50 with a history of 3 consecutive normal Pap tests.  Fecal occult blood test (FOBT) of stool. / Every year beginning at age 80 and continuing until age 44. You may not need to do this test if you get a colonoscopy every 10 years.  Flexible sigmoidoscopy or colonoscopy.** / Every 5 years for a flexible sigmoidoscopy or every 10 years for a colonoscopy beginning at age 43 and continuing until age 66.  Hepatitis C blood test.** / For  all people born from [birth year] through 1965 and any individual with known risks for hepatitis C.  Skin self-exam. / Monthly.  Influenza vaccine. / Every year.  Tetanus, diphtheria, and acellular pertussis (Tdap/Td) vaccine.** / Consult your caregiver. Pregnant women should receive 1 dose of Tdap vaccine during each pregnancy. 1 dose of Td every 10 years.  Varicella vaccine.** / Consult your caregiver. Pregnant females who do not have evidence of immunity should receive the first dose after pregnancy.  Zoster vaccine.** / 1 dose for adults aged 54 years or older.  Measles, mumps, rubella (MMR) vaccine.** / You need at least 1 dose of MMR if you were born in 1957 or later. You may also need a 2nd dose. For females of childbearing age, rubella immunity should be determined. If there is no evidence of immunity, females who are not pregnant should be vaccinated. If there is no evidence of immunity, females who are pregnant should delay immunization until after pregnancy.  Pneumococcal 13-valent conjugate (PCV13) vaccine.** / Consult your caregiver.  Pneumococcal polysaccharide (PPSV23) vaccine.** / 1 to 2 doses if you smoke cigarettes or if you have certain conditions.  Meningococcal vaccine.** / Consult your caregiver.  Hepatitis A vaccine.** / Consult your caregiver.  Hepatitis B vaccine.** / Consult your caregiver.  Haemophilus influenzae type b (Hib) vaccine.** / Consult your caregiver. Ages 60 and over  Blood pressure check.** / Every 1 to 2 years.  Lipid and cholesterol check.** / Every 5 years beginning at age 29.  Lung cancer screening. / Every year if you are aged 6 80 years and have a 30-pack-year history of smoking and currently smoke or have quit within the past 15 years. Yearly screening is stopped once you have quit smoking for at least 15 years or develop a health problem that would prevent you from having lung cancer treatment.  Clinical breast exam.** / Every year after age  78.  BRCA-related cancer risk assessment.** / For women who have family members with a BRCA-related cancer (breast, ovarian, tubal, or peritoneal cancers).  Mammogram.** / Every year beginning at age 35 and continuing for as long as you are in good health. Consult with your caregiver.  Pap test.** / Every 3 years starting at age 15 through age 42 or 18 with a 3 consecutive normal Pap tests. Testing can be stopped between 65 and 70 with 3 consecutive normal Pap tests and no abnormal Pap or HPV tests in the past 10 years.  HPV screening.** / Every 3 years from ages 32 through ages 65 or 10 with a history of 3 consecutive normal Pap tests. Testing can be stopped between 65 and 70 with 3 consecutive normal Pap tests and no abnormal Pap or HPV tests in the past 10 years.  Fecal occult blood test (FOBT) of stool. / Every year beginning at age 39 and continuing until age 65. You may not need to do this test if you get a colonoscopy every 10 years.  Flexible sigmoidoscopy or colonoscopy.** / Every 5 years for a flexible sigmoidoscopy or every 10 years for a colonoscopy beginning at age 63 and continuing until age 38.  Hepatitis C blood test.** / For all people born from [birth year] through 1965 and any individual with known risks for hepatitis C.  Osteoporosis screening.** / A one-time screening for women ages 50 and over and women at risk for fractures or osteoporosis.  Skin self-exam. / Monthly.  Influenza vaccine. / Every year.  Tetanus, diphtheria, and acellular pertussis (Tdap/Td) vaccine.** / 1 dose of Td every 10 years.  Varicella vaccine.** / Consult your caregiver.  Zoster vaccine.** / 1 dose for adults aged 50 years or older.  Pneumococcal 13-valent conjugate (PCV13) vaccine.** / Consult your caregiver.  Pneumococcal polysaccharide (PPSV23) vaccine.** / 1 dose for all adults aged 57 years and older.  Meningococcal vaccine.** / Consult your caregiver.  Hepatitis A vaccine.** / Consult  your caregiver.  Hepatitis B vaccine.** / Consult your caregiver.  Haemophilus influenzae type b (Hib) vaccine.** / Consult your caregiver. ** Family history and personal history of risk and conditions may change your caregiver's recommendations. Document Released: 12/19/2001 Document Revised: 02/17/2013 Document Reviewed: 03/20/2011 [facility] Patient Information 2014 [place], [place].     Neta Mends. Telicia Hodgkiss M.D.

## 2013-09-25 ENCOUNTER — Encounter: Payer: Self-pay | Admitting: Family Medicine

## 2013-09-25 NOTE — Progress Notes (Signed)
Quick Note:  Tell patient PAP is normal. ______ 

## 2013-09-26 ENCOUNTER — Encounter: Payer: Self-pay | Admitting: Family Medicine

## 2013-10-10 ENCOUNTER — Encounter: Payer: Self-pay | Admitting: Internal Medicine

## 2014-03-23 ENCOUNTER — Telehealth: Payer: Self-pay | Admitting: Internal Medicine

## 2014-03-23 NOTE — Telephone Encounter (Signed)
Ok to refill or we can do at appt

## 2014-03-23 NOTE — Telephone Encounter (Signed)
Patient has an appt for tomorrow.

## 2014-03-23 NOTE — Telephone Encounter (Signed)
Pt needs new rx generic concerta 27 mg

## 2014-03-24 ENCOUNTER — Encounter: Payer: Self-pay | Admitting: Internal Medicine

## 2014-03-24 NOTE — Progress Notes (Signed)
Document opened and reviewed for OV but appt  canceled same day .  

## 2014-03-24 NOTE — Telephone Encounter (Signed)
Patient made appt for 03/25/14

## 2014-03-25 ENCOUNTER — Ambulatory Visit (INDEPENDENT_AMBULATORY_CARE_PROVIDER_SITE_OTHER): Payer: Medicaid Other | Admitting: Internal Medicine

## 2014-03-25 ENCOUNTER — Encounter: Payer: Self-pay | Admitting: Internal Medicine

## 2014-03-25 VITALS — BP 96/62 | Temp 98.0°F | Ht 67.75 in | Wt 149.0 lb

## 2014-03-25 DIAGNOSIS — Z79899 Other long term (current) drug therapy: Secondary | ICD-10-CM

## 2014-03-25 DIAGNOSIS — F909 Attention-deficit hyperactivity disorder, unspecified type: Secondary | ICD-10-CM

## 2014-03-25 DIAGNOSIS — N946 Dysmenorrhea, unspecified: Secondary | ICD-10-CM

## 2014-03-25 MED ORDER — METHYLPHENIDATE HCL ER (OSM) 27 MG PO TBCR: 27.0000 mg | EXTENDED_RELEASE_TABLET | ORAL | Status: DC

## 2014-03-25 NOTE — Patient Instructions (Signed)
Continue    Medication.  Due for tetanus shot  We can give it at next visit  Preventive visit . Or as needed.  Preventive wellness visit and med check in 6 months .  Contact us for refills  Before then .

## 2014-03-25 NOTE — Telephone Encounter (Signed)
Patient seen in the office 03/25/14
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
## 2014-08-17 ENCOUNTER — Telehealth: Payer: Self-pay | Admitting: Internal Medicine

## 2014-08-17 DIAGNOSIS — F909 Attention-deficit hyperactivity disorder, unspecified type: Secondary | ICD-10-CM

## 2014-08-17 DIAGNOSIS — N946 Dysmenorrhea, unspecified: Secondary | ICD-10-CM

## 2014-08-17 DIAGNOSIS — Z79899 Other long term (current) drug therapy: Secondary | ICD-10-CM

## 2014-08-17 NOTE — Telephone Encounter (Signed)
Pt needs new rx concerta

## 2014-08-17 NOTE — Telephone Encounter (Signed)
Pt is due for CPX in Nov.  Please advise.  Thanks!

## 2014-08-17 NOTE — Telephone Encounter (Signed)
Ok to  Refill  X 2 months  Have her get on schedule for cpx ( can do as a work in as needed ) before the end of the year.

## 2014-08-17 NOTE — Telephone Encounter (Signed)
Pt does not have good cell phone reception.  She stated she will call back when she is home.  Will wait on her return call.

## 2014-08-20 NOTE — Telephone Encounter (Signed)
Pt has made  cpe for 11/20. Pt took her last pill today

## 2014-08-21 MED ORDER — METHYLPHENIDATE HCL ER (OSM) 27 MG PO TBCR: 27.0000 mg | EXTENDED_RELEASE_TABLET | ORAL | Status: DC

## 2014-08-21 NOTE — Addendum Note (Signed)
Addended by: Miles Costain T on: 08/21/2014 11:05 AM   Modules accepted: Orders

## 2014-08-21 NOTE — Telephone Encounter (Signed)
Left a message at the listed number informing the pt to pick up rx at the front desk.

## 2014-08-24 ENCOUNTER — Encounter: Payer: Self-pay | Admitting: Internal Medicine

## 2014-09-25 ENCOUNTER — Encounter: Payer: Self-pay | Admitting: Internal Medicine

## 2014-09-25 ENCOUNTER — Ambulatory Visit (INDEPENDENT_AMBULATORY_CARE_PROVIDER_SITE_OTHER): Payer: Medicaid Other | Admitting: Internal Medicine

## 2014-09-25 VITALS — BP 120/80 | Temp 97.6°F | Ht 67.0 in | Wt 148.2 lb

## 2014-09-25 DIAGNOSIS — D485 Neoplasm of uncertain behavior of skin: Secondary | ICD-10-CM

## 2014-09-25 DIAGNOSIS — Z23 Encounter for immunization: Secondary | ICD-10-CM

## 2014-09-25 DIAGNOSIS — F909 Attention-deficit hyperactivity disorder, unspecified type: Secondary | ICD-10-CM | POA: Insufficient documentation

## 2014-09-25 DIAGNOSIS — F849 Pervasive developmental disorder, unspecified: Secondary | ICD-10-CM

## 2014-09-25 DIAGNOSIS — Z79899 Other long term (current) drug therapy: Secondary | ICD-10-CM

## 2014-09-25 DIAGNOSIS — Z0000 Encounter for general adult medical examination without abnormal findings: Secondary | ICD-10-CM | POA: Insufficient documentation

## 2014-09-25 DIAGNOSIS — N926 Irregular menstruation, unspecified: Secondary | ICD-10-CM

## 2014-09-25 MED ORDER — NORETHIN ACE-ETH ESTRAD-FE 1.5-30 MG-MCG PO TABS: 1.0000 | ORAL_TABLET | Freq: Every day | ORAL | Status: AC

## 2014-09-25 MED ORDER — METHYLPHENIDATE HCL ER 36 MG PO TB24: 36.0000 mg | ORAL_TABLET | Freq: Every day | ORAL | Status: DC

## 2014-09-25 NOTE — Patient Instructions (Signed)
   Can try increase dose of concerta and let us know if works better for you.  Get dermatologist to check the small mole we discussed in your upper mid right back .  You had a tdap today ( tetanus booster)   Healthy lifestyle includes : At least 150 minutes of exercise weeks  , weight at healthy levels, which is usually   BMI 19-25. Avoid trans fats and processed foods;  Increase fresh fruits and veges to 5 servings per day. And avoid sweet beverages including tea and juice. Mediterranean diet with olive oil and nuts have been noted to be heart and brain healthy . Avoid tobacco products . Limit  alcohol to  7 per week for women and 14 servings for men.  Get adequate sleep . Wear seat belts . Don't text and drive .
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
## 2014-11-20 ENCOUNTER — Telehealth: Payer: Self-pay | Admitting: Internal Medicine

## 2014-11-20 NOTE — Telephone Encounter (Signed)
Patient is requesting re-fill on methylphenidate 27 MG PO CR tablet.  She seemed unsure of the correct MG needed.

## 2014-11-23 NOTE — Telephone Encounter (Signed)
Left a message for the pt to return my call.  Need to see how she is doing on new strength of 36 mg.

## 2014-11-25 MED ORDER — METHYLPHENIDATE HCL ER 36 MG PO TB24: 36.0000 mg | ORAL_TABLET | Freq: Every day | ORAL | Status: DC

## 2014-11-25 NOTE — Telephone Encounter (Signed)
Spoke to the pt.  She lost the new prescription for new strength.  Will print out a 30 day supply for her to try.  She will call back to report how she is doing.

## 2015-01-26 ENCOUNTER — Telehealth: Payer: Self-pay | Admitting: Internal Medicine

## 2015-01-26 NOTE — Telephone Encounter (Signed)
Pt request refill methylphenidate 36 MG PO CR tablet

## 2015-01-26 NOTE — Telephone Encounter (Signed)
LM for the pt to return my call.  Need to know how she is doing on the new strength.  Any side effects?

## 2015-01-27 NOTE — Telephone Encounter (Signed)
Ok to refill concerta  For 3 months on new strength Then due for Ov med check before runs out.

## 2015-01-27 NOTE — Telephone Encounter (Signed)
Spoke to the pt.  She is doing fine on the new strength.  Reports no side effects.  Will send to [facility] to find out how much to fill.

## 2015-01-28 MED ORDER — METHYLPHENIDATE HCL ER 36 MG PO TB24: 36.0000 mg | ORAL_TABLET | Freq: Every day | ORAL | Status: DC

## 2015-01-28 MED ORDER — METHYLPHENIDATE HCL ER (OSM) 36 MG PO TBCR: 36.0000 mg | EXTENDED_RELEASE_TABLET | Freq: Every day | ORAL | Status: DC

## 2015-01-28 NOTE — Telephone Encounter (Signed)
Left a message for the pt to pick up at the front desk. 

## 2015-05-06 ENCOUNTER — Encounter: Payer: Self-pay | Admitting: Internal Medicine

## 2015-05-06 ENCOUNTER — Ambulatory Visit (INDEPENDENT_AMBULATORY_CARE_PROVIDER_SITE_OTHER): Payer: Medicare Other | Admitting: Internal Medicine

## 2015-05-06 VITALS — BP 120/74 | Temp 97.4°F | Ht 67.0 in | Wt 146.1 lb

## 2015-05-06 DIAGNOSIS — F909 Attention-deficit hyperactivity disorder, unspecified type: Secondary | ICD-10-CM

## 2015-05-06 DIAGNOSIS — Z79899 Other long term (current) drug therapy: Secondary | ICD-10-CM | POA: Diagnosis not present

## 2015-05-06 MED ORDER — METHYLPHENIDATE HCL ER (OSM) 36 MG PO TBCR: 36.0000 mg | EXTENDED_RELEASE_TABLET | Freq: Every day | ORAL | Status: AC

## 2015-05-06 MED ORDER — METHYLPHENIDATE HCL ER 36 MG PO TB24: 36.0000 mg | ORAL_TABLET | Freq: Every day | ORAL | Status: AC
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
## 2015-05-06 NOTE — Patient Instructions (Signed)
Continue on same dose  Of medication . Seems to help you. Refill medication today. Due for wellness visit and med check in 4-6 months  Or as needed

## 2015-12-09 ENCOUNTER — Other Ambulatory Visit: Payer: Self-pay | Admitting: Internal Medicine

## 2015-12-09 NOTE — Telephone Encounter (Signed)
Denied.  Pt is past due for wellness.  Message sent to the pharmacy to have the pt contact the office.

## 2016-12-12 ENCOUNTER — Telehealth: Payer: Self-pay | Admitting: Internal Medicine

## 2016-12-12 NOTE — Telephone Encounter (Signed)
Rec'd from DDS forward 7 pages to Dr. Regis Bill

## 2017-01-04 DIAGNOSIS — H10413 Chronic giant papillary conjunctivitis, bilateral: Secondary | ICD-10-CM | POA: Diagnosis not present

## 2020-02-06 ENCOUNTER — Ambulatory Visit: Payer: Medicare Other | Attending: Internal Medicine

## 2020-02-06 DIAGNOSIS — Z23 Encounter for immunization: Secondary | ICD-10-CM

## 2020-02-06 NOTE — Progress Notes (Signed)
   Covid-19 Vaccination Clinic  Name:  Bethany Davila    MRN: [id number] DOB: 09-25-1986  02/06/2020  [name] was observed post Covid-19 immunization for 15 minutes without incident. She was provided with Vaccine Information Sheet and instruction to access the V-Safe system.   [name] was instructed to call 911 with any severe reactions post vaccine: Marland Kitchen Difficulty breathing  . Swelling of face and throat  . A fast heartbeat  . A bad rash all over body  . Dizziness and weakness   Immunizations Administered    Name Date Dose VIS Date Route   Pfizer COVID-19 Vaccine 02/06/2020  3:47 PM 0.3 mL 10/17/2019 Intramuscular   Manufacturer: [employer], [employer]   Lot: [id number]   [place]: [id number]

## 2020-03-01 ENCOUNTER — Ambulatory Visit: Payer: Medicare Other | Attending: Internal Medicine

## 2020-03-01 DIAGNOSIS — Z23 Encounter for immunization: Secondary | ICD-10-CM

## 2020-03-01 NOTE — Progress Notes (Signed)
   Covid-19 Vaccination Clinic  Name:  GLORIANN YONKE    MRN: [id number] DOB: [birth year]  03/01/2020  Ms. Sabbagh was observed post Covid-19 immunization for 15 minutes without incident. She was provided with Vaccine Information Sheet and instruction to access the V-Safe system.   Ms. Kopko was instructed to call 911 with any severe reactions post vaccine: Marland Kitchen Difficulty breathing  . Swelling of face and throat  . A fast heartbeat  . A bad rash all over body  . Dizziness and weakness   Immunizations Administered    Name Date Dose VIS Date Route   Pfizer COVID-19 Vaccine 03/01/2020  2:49 PM 0.3 mL 12/31/2018 Intramuscular   Manufacturer: [place]   Lot: [id number]   [place]: [id number]

## 2020-08-31 DIAGNOSIS — Z23 Encounter for immunization: Secondary | ICD-10-CM | POA: Diagnosis not present

## 2020-10-08 ENCOUNTER — Ambulatory Visit: Payer: Medicare Other | Attending: Internal Medicine

## 2020-10-08 DIAGNOSIS — Z23 Encounter for immunization: Secondary | ICD-10-CM

## 2020-10-08 NOTE — Progress Notes (Signed)
   Covid-19 Vaccination Clinic  Name:  Bethany Davila    MRN: [id number] DOB: 04-03-1986  10/08/2020  [name] was observed post Covid-19 immunization for 15 minutes without incident. She was provided with Vaccine Information Sheet and instruction to access the V-Safe system.   [name] was instructed to call 911 with any severe reactions post vaccine: Marland Kitchen Difficulty breathing  . Swelling of face and throat  . A fast heartbeat  . A bad rash all over body  . Dizziness and weakness   Immunizations Administered    Name Date Dose VIS Date Route   Pfizer COVID-19 Vaccine 10/08/2020  3:30 PM 0.3 mL 08/25/2020 Intramuscular   Manufacturer: [place]   Lot: [id number]   [place]: [address]-9

## 2020-11-15 DIAGNOSIS — Z1152 Encounter for screening for COVID-19: Secondary | ICD-10-CM | POA: Diagnosis not present

## 2021-10-03 ENCOUNTER — Other Ambulatory Visit (HOSPITAL_BASED_OUTPATIENT_CLINIC_OR_DEPARTMENT_OTHER): Payer: Self-pay

## 2021-10-03 ENCOUNTER — Ambulatory Visit: Payer: Medicare Other | Attending: Internal Medicine

## 2021-10-03 DIAGNOSIS — Z23 Encounter for immunization: Secondary | ICD-10-CM

## 2021-10-03 MED ORDER — PFIZER COVID-19 VAC BIVALENT 30 MCG/0.3ML IM SUSP
INTRAMUSCULAR | 0 refills | Status: AC
  Filled 2021-10-03: qty 0.3, 1d supply, fill #0

## 2021-10-03 NOTE — Progress Notes (Signed)
   Covid-19 Vaccination Clinic  Name:  Bethany Davila    MRN: [id number] DOB: Apr 15, 1986  10/03/2021  Bethany Davila was observed post Covid-19 immunization for 15 minutes without incident. She was provided with Vaccine Information Sheet and instruction to access the V-Safe system.   Bethany Davila was instructed to call 911 with any severe reactions post vaccine: Difficulty breathing  Swelling of face and throat  A fast heartbeat  A bad rash all over body  Dizziness and weakness   Immunizations Administered     Name Date Dose VIS Date Route   Pfizer Covid-19 Vaccine Bivalent Booster 10/03/2021  1:09 PM 0.3 mL 07/06/2021 Intramuscular   Manufacturer: [place]   Lot: [id number]   [name]: [id number]

## 2021-10-05 DIAGNOSIS — Z23 Encounter for immunization: Secondary | ICD-10-CM | POA: Diagnosis not present

## 2021-11-10 ENCOUNTER — Telehealth: Payer: Self-pay | Admitting: Internal Medicine

## 2021-11-10 NOTE — Telephone Encounter (Signed)
Noted  

## 2021-11-10 NOTE — Telephone Encounter (Signed)
Patient's mother called to see if patient could be scheduled for a CPE and Pap. I let her know that, unfortunately, her daughter was last seen in 2016, therefore she is a new patient as we had a 3 year rule and she would not be able to be seen in our office. Mother became upset and told me "No, we had covid, the rule should be extended from 3 to 6 years. I am a retired physician so I would know" and asked to speak to someone who could "overrule me". I went to get Bethany Davila and by the time I had came back to transfer she had hung up.  FYI
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
## 2021-12-14 ENCOUNTER — Ambulatory Visit (INDEPENDENT_AMBULATORY_CARE_PROVIDER_SITE_OTHER): Payer: Medicare Other | Admitting: Internal Medicine

## 2021-12-14 ENCOUNTER — Other Ambulatory Visit (HOSPITAL_COMMUNITY)
Admission: RE | Admit: 2021-12-14 | Discharge: 2021-12-14 | Disposition: A | Discharge status: Home / Self Care | Payer: Medicare Other | Source: Ambulatory Visit | Attending: Internal Medicine | Admitting: Internal Medicine

## 2021-12-14 ENCOUNTER — Encounter: Payer: Self-pay | Admitting: Internal Medicine

## 2021-12-14 VITALS — BP 106/60 | HR 62 | Temp 97.7°F | Ht 67.0 in | Wt 163.6 lb

## 2021-12-14 DIAGNOSIS — F849 Pervasive developmental disorder, unspecified: Secondary | ICD-10-CM

## 2021-12-14 DIAGNOSIS — Z1151 Encounter for screening for human papillomavirus (HPV): Secondary | ICD-10-CM | POA: Insufficient documentation

## 2021-12-14 DIAGNOSIS — Z0000 Encounter for general adult medical examination without abnormal findings: Secondary | ICD-10-CM

## 2021-12-14 DIAGNOSIS — E785 Hyperlipidemia, unspecified: Secondary | ICD-10-CM | POA: Diagnosis not present

## 2021-12-14 DIAGNOSIS — Z131 Encounter for screening for diabetes mellitus: Secondary | ICD-10-CM | POA: Diagnosis not present

## 2021-12-14 DIAGNOSIS — Z01419 Encounter for gynecological examination (general) (routine) without abnormal findings: Secondary | ICD-10-CM | POA: Insufficient documentation

## 2021-12-14 DIAGNOSIS — Z833 Family history of diabetes mellitus: Secondary | ICD-10-CM | POA: Diagnosis not present

## 2021-12-14 DIAGNOSIS — N946 Dysmenorrhea, unspecified: Secondary | ICD-10-CM

## 2021-12-14 LAB — CBC WITH DIFFERENTIAL/PLATELET
Basophils Absolute: 0 10*3/uL (ref 0.0–0.1)
Basophils Relative: 0.6 % (ref 0.0–3.0)
Eosinophils Absolute: 0.1 10*3/uL (ref 0.0–0.7)
Eosinophils Relative: 1.1 % (ref 0.0–5.0)
HCT: 38.7 % (ref 36.0–46.0)
Hemoglobin: 12.7 g/dL (ref 12.0–15.0)
Lymphocytes Relative: 38.1 % (ref 12.0–46.0)
Lymphs Abs: 2.3 10*3/uL (ref 0.7–4.0)
MCHC: 32.8 g/dL (ref 30.0–36.0)
MCV: 85.3 fl (ref 78.0–100.0)
Monocytes Absolute: 0.5 10*3/uL (ref 0.1–1.0)
Monocytes Relative: 8.4 % (ref 3.0–12.0)
Neutro Abs: 3.2 10*3/uL (ref 1.4–7.7)
Neutrophils Relative %: 51.8 % (ref 43.0–77.0)
Platelets: 243 10*3/uL (ref 150.0–400.0)
RBC: 4.54 Mil/uL (ref 3.87–5.11)
RDW: 14.1 % (ref 11.5–15.5)
WBC: 6.2 10*3/uL (ref 4.0–10.5)

## 2021-12-14 NOTE — Patient Instructions (Addendum)
Good to see you today .  Will notify you  of labs when available.  Including cholesterol sugar and anemia check   Pap smear  results pending    normal exam  If all ok then yearly check exam. As indicated .

## 2021-12-15 LAB — BASIC METABOLIC PANEL
BUN: 22 mg/dL (ref 6–23)
CO2: 24 mEq/L (ref 19–32)
Calcium: 9.4 mg/dL (ref 8.4–10.5)
Chloride: 106 mEq/L (ref 96–112)
Creatinine, Ser: 0.78 mg/dL (ref 0.40–1.20)
GFR: 98.42 mL/min (ref >60.00)
Glucose, Bld: 78 mg/dL (ref 70–99)
Potassium: 4.2 mEq/L (ref 3.5–5.1)
Sodium: 136 mEq/L (ref 135–145)

## 2021-12-15 LAB — HEPATIC FUNCTION PANEL
ALT: 13 U/L (ref 0–35)
AST: 13 U/L (ref 0–37)
Albumin: 4.2 g/dL (ref 3.5–5.2)
Alkaline Phosphatase: 36 U/L — ABNORMAL LOW (ref 39–117)
Bilirubin, Direct: 0.1 mg/dL (ref 0.0–0.3)
Total Bilirubin: 0.6 mg/dL (ref 0.2–1.2)
Total Protein: 7.3 g/dL (ref 6.0–8.3)

## 2021-12-15 LAB — LIPID PANEL
Cholesterol: 218 mg/dL — ABNORMAL HIGH (ref 0–200)
HDL: 84.1 mg/dL (ref >39.00)
LDL Cholesterol: 117 mg/dL — ABNORMAL HIGH (ref 0–99)
NonHDL: 133.58
Total CHOL/HDL Ratio: 3
Triglycerides: 85 mg/dL (ref 0.0–149.0)
VLDL: 17 mg/dL (ref 0.0–40.0)

## 2021-12-15 NOTE — Progress Notes (Signed)
Blood results  sugar normal  No diabetes .  No anemia Cholesterol better than last time still slightly elevated . Continue  attention lifestyle intervention healthy eating and exercise .  Can give her  a copy of results .or sign up for my chart  PAP result is not back yet

## 2021-12-20 NOTE — Progress Notes (Signed)
Report to patient the Pap smear is normal Also see if Cytology can add on HPV cotesting ,as requested  but does not look like it was ordered correctly or performed .

## 2021-12-21 LAB — CYTOLOGY - PAP
Comment: NEGATIVE
Diagnosis: NEGATIVE
High risk HPV: NEGATIVE

## 2022-02-03 ENCOUNTER — Ambulatory Visit (INDEPENDENT_AMBULATORY_CARE_PROVIDER_SITE_OTHER): Payer: Medicare Other

## 2022-02-03 VITALS — BP 120/62 | HR 75 | Temp 97.5°F | Ht 67.0 in | Wt 160.0 lb

## 2022-02-03 DIAGNOSIS — Z0000 Encounter for general adult medical examination without abnormal findings: Secondary | ICD-10-CM | POA: Diagnosis not present

## 2022-02-03 NOTE — Patient Instructions (Addendum)
?Ms. Tirpak , ?Thank you for taking time to come for your Medicare Wellness Visit. I appreciate your ongoing commitment to your health goals. Please review the following plan we discussed and let me know if I can assist you in the future.  ? ?These are the goals we discussed: ? Goals   ? ?   Maintain current health (pt-stated)   ? ?  ?  ?This is a list of the screening recommended for you and due dates:  ?Health Maintenance  ?Topic Date Due  ? Hepatitis C Screening: USPSTF Recommendation to screen - Ages 18-79 yo.  06/13/2022*  ? HIV Screening  06/13/2022*  ? Tetanus Vaccine  09/25/2024  ? Pap Smear  12/14/2024  ? Flu Shot  Completed  ? COVID-19 Vaccine  Completed  ? HPV Vaccine  Aged Out  ?*Topic was postponed. The date shown is not the original due date.  ? ? ?Advanced directives: No ? ?Conditions/risks identified: None ? ?Next appointment: Follow up in one year for your annual wellness visit.  ? ?Preventive Care 40-64 Years, Female ?Preventive care refers to lifestyle choices and visits with your health care provider that can promote health and wellness. ?What does preventive care include? ?A yearly physical exam. This is also called an annual well check. ?Dental exams once or twice a year. ?Routine eye exams. Ask your health care provider how often you should have your eyes checked. ?Personal lifestyle choices, including: ?Daily care of your teeth and gums. ?Regular physical activity. ?Eating a healthy diet. ?Avoiding tobacco and drug use. ?Limiting alcohol use. ?Practicing safe sex. ?Taking low-dose aspirin daily starting at age 36. ?Taking vitamin and mineral supplements as recommended by your health care provider. ?What happens during an annual well check? ?The services and screenings done by your health care provider during your annual well check will depend on your age, overall health, lifestyle risk factors, and family history of disease. ?Counseling  ?Your health care provider may ask you questions about  your: ?Alcohol use. ?Tobacco use. ?Drug use. ?Emotional well-being. ?Home and relationship well-being. ?Sexual activity. ?Eating habits. ?Work and work Statistician. ?Method of birth control. ?Menstrual cycle. ?Pregnancy history. ?Screening  ?You may have the following tests or measurements: ?Height, weight, and BMI. ?Blood pressure. ?Lipid and cholesterol levels. These may be checked every 5 years, or more frequently if you are over 81 years old. ?Skin check. ?Lung cancer screening. You may have this screening every year starting at age 43 if you have a 30-pack-year history of smoking and currently smoke or have quit within the past 15 years. ?Fecal occult blood test (FOBT) of the stool. You may have this test every year starting at age 59. ?Flexible sigmoidoscopy or colonoscopy. You may have a sigmoidoscopy every 5 years or a colonoscopy every 10 years starting at age 71. ?Hepatitis C blood test. ?Hepatitis B blood test. ?Sexually transmitted disease (STD) testing. ?Diabetes screening. This is done by checking your blood sugar (glucose) after you have not eaten for a while (fasting). You may have this done every 1-3 years. ?Mammogram. This may be done every 1-2 years. Talk to your health care provider about when you should start having regular mammograms. This may depend on whether you have a family history of breast cancer. ?BRCA-related cancer screening. This may be done if you have a family history of breast, ovarian, tubal, or peritoneal cancers. ?Pelvic exam and Pap test. This may be done every 3 years starting at age 45. Starting at age 48,  this may be done every 5 years if you have a Pap test in combination with an HPV test. ?Bone density scan. This is done to screen for osteoporosis. You may have this scan if you are at high risk for osteoporosis. ?Discuss your test results, treatment options, and if necessary, the need for more tests with your health care provider. ?Vaccines  ?Your health care provider may  recommend certain vaccines, such as: ?Influenza vaccine. This is recommended every year. ?Tetanus, diphtheria, and acellular pertussis (Tdap, Td) vaccine. You may need a Td booster every 10 years. ?Zoster vaccine. You may need this after age 72. ?Pneumococcal 13-valent conjugate (PCV13) vaccine. You may need this if you have certain conditions and were not previously vaccinated. ?Pneumococcal polysaccharide (PPSV23) vaccine. You may need one or two doses if you smoke cigarettes or if you have certain conditions. ?Talk to your health care provider about which screenings and vaccines you need and how often you need them. ?This information is not intended to replace advice given to you by your health care provider. Make sure you discuss any questions you have with your health care provider. ?Document Released: 11/19/2015 Document Revised: 07/12/2016 Document Reviewed: 08/24/2015 ?Elsevier Interactive Patient Education ? 2017 [employer] ? ? ? ?
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
## 2022-08-23 DIAGNOSIS — Z23 Encounter for immunization: Secondary | ICD-10-CM | POA: Diagnosis not present

## 2022-12-14 ENCOUNTER — Telehealth: Payer: Self-pay | Admitting: Internal Medicine

## 2022-12-14 NOTE — Telephone Encounter (Signed)
Patient dropped off document to be filled out by provider  [employer] Eligibility Form . Patient requested to send it via Call Patient to pick up within 5-days. Document is located in providers tray at front office.   Call Merton Border [phone]  Please advise.

## 2022-12-19 NOTE — Telephone Encounter (Signed)
Completes and signed

## 2022-12-22 NOTE — Telephone Encounter (Signed)
Attempt to reach pt. Left detail message that the form is ready to pick up at the [facility]. To give [place] a call if have any question.

## 2023-02-27 ENCOUNTER — Telehealth: Payer: Self-pay | Admitting: Internal Medicine

## 2023-02-27 NOTE — Telephone Encounter (Signed)
Called patient to schedule Medicare Annual Wellness Visit (AWV). Left message for patient to call back and schedule Medicare Annual Wellness Visit (AWV).  Last date of AWV: 02/03/22  Please schedule an appointment at any time with [facility] or [facility] [employer] [facility].  If any questions, please contact me at [phone].  Thank you ,  Rudell Cobb AWV direct phone # [phone]

## 2024-10-08 ENCOUNTER — Ambulatory Visit: Payer: Self-pay | Admitting: Internal Medicine

## 2024-10-08 ENCOUNTER — Encounter: Payer: Self-pay | Admitting: Internal Medicine

## 2024-10-08 ENCOUNTER — Ambulatory Visit: Admitting: Internal Medicine

## 2024-10-08 VITALS — BP 106/74 | HR 65 | Temp 97.6°F | Ht 67.52 in | Wt 166.0 lb

## 2024-10-08 DIAGNOSIS — E7800 Pure hypercholesterolemia, unspecified: Secondary | ICD-10-CM

## 2024-10-08 DIAGNOSIS — F849 Pervasive developmental disorder, unspecified: Secondary | ICD-10-CM

## 2024-10-08 DIAGNOSIS — Z79899 Other long term (current) drug therapy: Secondary | ICD-10-CM

## 2024-10-08 DIAGNOSIS — Z23 Encounter for immunization: Secondary | ICD-10-CM

## 2024-10-08 DIAGNOSIS — Z833 Family history of diabetes mellitus: Secondary | ICD-10-CM | POA: Diagnosis not present

## 2024-10-08 LAB — LIPID PANEL
Cholesterol: 240 mg/dL — ABNORMAL HIGH (ref 0–200)
HDL: 83.7 mg/dL (ref >39.00)
LDL Cholesterol: 142 mg/dL — ABNORMAL HIGH (ref 0–99)
NonHDL: 156.24
Total CHOL/HDL Ratio: 3
Triglycerides: 69 mg/dL (ref 0.0–149.0)
VLDL: 13.8 mg/dL (ref 0.0–40.0)

## 2024-10-08 LAB — COMPREHENSIVE METABOLIC PANEL WITH GFR
ALT: 13 U/L (ref 0–35)
AST: 14 U/L (ref 0–37)
Albumin: 4.6 g/dL (ref 3.5–5.2)
Alkaline Phosphatase: 44 U/L (ref 39–117)
BUN: 14 mg/dL (ref 6–23)
CO2: 25 meq/L (ref 19–32)
Calcium: 9.6 mg/dL (ref 8.4–10.5)
Chloride: 102 meq/L (ref 96–112)
Creatinine, Ser: 0.62 mg/dL (ref 0.40–1.20)
GFR: 113.13 mL/min (ref >60.00)
Glucose, Bld: 89 mg/dL (ref 70–99)
Potassium: 4.4 meq/L (ref 3.5–5.1)
Sodium: 137 meq/L (ref 135–145)
Total Bilirubin: 1.1 mg/dL (ref 0.2–1.2)
Total Protein: 7.1 g/dL (ref 6.0–8.3)

## 2024-10-08 LAB — CBC WITH DIFFERENTIAL/PLATELET
Basophils Absolute: 0.1 K/uL (ref 0.0–0.1)
Basophils Relative: 0.9 % (ref 0.0–3.0)
Eosinophils Absolute: 0.1 K/uL (ref 0.0–0.7)
Eosinophils Relative: 1.8 % (ref 0.0–5.0)
HCT: 40.3 % (ref 36.0–46.0)
Hemoglobin: 13.9 g/dL (ref 12.0–15.0)
Lymphocytes Relative: 39.9 % (ref 12.0–46.0)
Lymphs Abs: 2.5 K/uL (ref 0.7–4.0)
MCHC: 34.5 g/dL (ref 30.0–36.0)
MCV: 88.3 fl (ref 78.0–100.0)
Monocytes Absolute: 0.5 K/uL (ref 0.1–1.0)
Monocytes Relative: 7.9 % (ref 3.0–12.0)
Neutro Abs: 3.1 K/uL (ref 1.4–7.7)
Neutrophils Relative %: 49.5 % (ref 43.0–77.0)
Platelets: 276 K/uL (ref 150.0–400.0)
RBC: 4.57 Mil/uL (ref 3.87–5.11)
RDW: 13.9 % (ref 11.5–15.5)
WBC: 6.3 K/uL (ref 4.0–10.5)

## 2024-10-08 LAB — HEMOGLOBIN A1C: Hgb A1c MFr Bld: 5 % (ref 4.6–6.5)

## 2024-10-08 LAB — TSH: TSH: 1.33 u[IU]/mL (ref 0.35–5.50)

## 2024-10-08 NOTE — Progress Notes (Signed)
 Blood sugar  blood count normal range  Cholesterol up some from last check  Attention to diet and exercise as we discussed to maintain health

## 2024-10-08 NOTE — Patient Instructions (Addendum)
 Good to see you today  Exam is normal . Last pap was 2 23 and neg HPV  risk  so not due until 26-28 year depending ( 3-5 years)  Updating lab non fasting to check glucose and cholesterol etc.   Continue lifestyle intervention healthy eating and exercise . Maybe add  weights muscle exercise  and avoid simple sugars and  processed foods.
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
## 2024-10-14 LAB — LIPOPROTEIN A (LPA): Lipoprotein (a): <10 nmol/L (ref <75)

## 2024-10-22 NOTE — Progress Notes (Signed)
 Lipo A level favorable  reassuring
